# Patient Record
Sex: Female | Born: 1965 | Race: White | Hispanic: No | Marital: Single | Smoking: Never smoker
Health system: Southern US, Community
[De-identification: ages and names within clinical notes are randomized; demographics above are authoritative.]

## PROBLEM LIST (undated history)

## (undated) DIAGNOSIS — F32A Depression, unspecified: Secondary | ICD-10-CM

## (undated) DIAGNOSIS — E079 Disorder of thyroid, unspecified: Secondary | ICD-10-CM

## (undated) DIAGNOSIS — Z87442 Personal history of urinary calculi: Secondary | ICD-10-CM

## (undated) DIAGNOSIS — F329 Major depressive disorder, single episode, unspecified: Secondary | ICD-10-CM

## (undated) HISTORY — PX: CHOLECYSTECTOMY: SHX55

---

## 2003-11-25 ENCOUNTER — Emergency Department: Payer: Self-pay | Admitting: Emergency Medicine

## 2016-03-07 ENCOUNTER — Encounter (HOSPITAL_COMMUNITY): Payer: Self-pay | Admitting: *Deleted

## 2016-03-07 ENCOUNTER — Emergency Department (HOSPITAL_COMMUNITY)
Admission: EM | Admit: 2016-03-07 | Discharge: 2016-03-07 | Disposition: A | Discharge status: Home / Self Care | Payer: BLUE CROSS/BLUE SHIELD | Attending: Emergency Medicine | Admitting: Emergency Medicine

## 2016-03-07 DIAGNOSIS — R5383 Other fatigue: Secondary | ICD-10-CM | POA: Diagnosis present

## 2016-03-07 DIAGNOSIS — R112 Nausea with vomiting, unspecified: Secondary | ICD-10-CM | POA: Insufficient documentation

## 2016-03-07 DIAGNOSIS — J111 Influenza due to unidentified influenza virus with other respiratory manifestations: Secondary | ICD-10-CM | POA: Insufficient documentation

## 2016-03-07 DIAGNOSIS — R69 Illness, unspecified: Secondary | ICD-10-CM

## 2016-03-07 DIAGNOSIS — R197 Diarrhea, unspecified: Secondary | ICD-10-CM | POA: Insufficient documentation

## 2016-03-07 HISTORY — DX: Depression, unspecified: F32.A

## 2016-03-07 HISTORY — DX: Disorder of thyroid, unspecified: E07.9

## 2016-03-07 HISTORY — DX: Major depressive disorder, single episode, unspecified: F32.9

## 2016-03-07 LAB — COMPREHENSIVE METABOLIC PANEL
ALBUMIN: 3.9 g/dL (ref 3.5–5.0)
ALK PHOS: 70 U/L (ref 38–126)
ALT: 40 U/L (ref 14–54)
ANION GAP: 8 (ref 5–15)
AST: 42 U/L — ABNORMAL HIGH (ref 15–41)
BUN: 8 mg/dL (ref 6–20)
CALCIUM: 10.1 mg/dL (ref 8.9–10.3)
CHLORIDE: 105 mmol/L (ref 101–111)
CO2: 27 mmol/L (ref 22–32)
Creatinine, Ser: 0.73 mg/dL (ref 0.44–1.00)
GFR calc non Af Amer: >60 mL/min (ref >60)
Glucose, Bld: 93 mg/dL (ref 65–99)
POTASSIUM: 4.3 mmol/L (ref 3.5–5.1)
SODIUM: 140 mmol/L (ref 135–145)
Total Bilirubin: 0.5 mg/dL (ref 0.3–1.2)
Total Protein: 7.3 g/dL (ref 6.5–8.1)

## 2016-03-07 LAB — CBC
HEMATOCRIT: 40.5 % (ref 36.0–46.0)
HEMOGLOBIN: 13.6 g/dL (ref 12.0–15.0)
MCH: 29.4 pg (ref 26.0–34.0)
MCHC: 33.6 g/dL (ref 30.0–36.0)
MCV: 87.5 fL (ref 78.0–100.0)
Platelets: 233 10*3/uL (ref 150–400)
RBC: 4.63 MIL/uL (ref 3.87–5.11)
RDW: 13.4 % (ref 11.5–15.5)
WBC: 7.3 10*3/uL (ref 4.0–10.5)

## 2016-03-07 LAB — LIPASE, BLOOD: LIPASE: 24 U/L (ref 11–51)

## 2016-03-07 LAB — I-STAT TROPONIN, ED: Troponin i, poc: 0 ng/mL (ref 0.00–0.08)

## 2016-03-07 LAB — TSH: TSH: 0.672 u[IU]/mL (ref 0.350–4.500)

## 2016-03-07 LAB — T4, FREE: Free T4: 0.96 ng/dL (ref 0.61–1.12)

## 2016-03-07 MED ORDER — SODIUM CHLORIDE 0.9 % IV BOLUS (SEPSIS)
1000.0000 mL | Freq: Once | INTRAVENOUS | Status: AC
  Administered 2016-03-07: 1000 mL via INTRAVENOUS

## 2016-03-07 MED ORDER — ONDANSETRON HCL 4 MG/2ML IJ SOLN
4.0000 mg | Freq: Once | INTRAMUSCULAR | Status: AC
  Administered 2016-03-07: 4 mg via INTRAVENOUS
  Filled 2016-03-07: qty 2

## 2016-03-07 MED ORDER — ONDANSETRON HCL 4 MG PO TABS: 4.0000 mg | ORAL_TABLET | Freq: Four times a day (QID) | ORAL | 0 refills | Status: AC

## 2016-03-07 NOTE — ED Provider Notes (Signed)
[facility]-EMERGENCY DEPT Provider Note   CSN: [id number] Arrival date & time: 03/07/16  1247   By signing my name below, I, Soijett Blue, attest that this documentation has been prepared under the direction and in the presence of Lavera Guiseana Duo Genea Rheaume, MD. Electronically Signed: Soijett Blue, ED Scribe. 03/07/16. 3:02 PM.  History   Chief Complaint Chief Complaint  Patient presents with  . Dizziness  . Fatigue  . Diarrhea    HPI Sierra Spears is a 51 y.o. female with a PMHx of thyroid dx, who presents to the Emergency Department complaining of dizziness onset 4 days ago. Pt states that she stopped taking her thyroid medications 5 months ago due to financial issues. Pt notes that she was seen at [facility] urgent care with negative strep, flu, and unremarkable EKG and informed to come into the ED for further evaluation. Pt is having associated symptoms of productive cough x yellow sputum, nasal congestion, intermittent fever of 101.7, generalized weakness, chills, vomiting, diarrhea, and urinary frequency. She hasn't tried any medications for the relief of her symptoms. She denies dysuria and any other symptoms. Pt notes that she does have a PCP. Pt notes that her LMP was 5 months ago and she no longer has menstrual cycles.    The history is provided by the patient. No language interpreter was used.    Past Medical History:  Diagnosis Date  . Depression   . Thyroid disease     There are no active problems to display for this patient.   History reviewed. No pertinent surgical history.  OB History    No data available       Home Medications    Prior to Admission medications   Not on File    Family History History reviewed. No pertinent family history.  Social History Social History  Substance Use Topics  . Smoking status: Never Smoker  . Smokeless tobacco: Not on file  . Alcohol use No     Allergies   Patient has no known allergies.   Review of Systems Review of  Systems 10/14 systems reviewed and are negative other than those stated in the HPI  Physical Exam Updated Vital Signs BP 125/87 (BP Location: Left Arm)   Pulse 76   Temp 99 F (37.2 C) (Oral)   Resp 18   Ht 5' 3.5" (1.613 m)   Wt 132 lb 2 oz (59.9 kg)   SpO2 100%   BMI 23.04 kg/m   Physical Exam Physical Exam  Nursing note and vitals reviewed. Constitutional: Well developed, well nourished, non-toxic, and in no acute distress Head: Normocephalic and atraumatic.  Mouth/Throat: Oropharynx is clear and moist.  Neck: Normal range of motion. Neck supple.  Cardiovascular: Normal rate and regular rhythm.   Pulmonary/Chest: Effort normal and breath sounds normal.  Abdominal: Soft. There is no tenderness. There is no rebound and no guarding.  Musculoskeletal: Normal range of motion.  Neurological: Alert, no facial droop, fluent speech, moves all extremities symmetrically Skin: Skin is warm and dry.  Psychiatric: Cooperative   ED Treatments / Results  DIAGNOSTIC STUDIES: Oxygen Saturation is 100% on RA, nl by my interpretation.    COORDINATION OF CARE: 3:00 PM Discussed treatment plan with pt at bedside which includes labs, UA, EKG, and pt agreed to plan.   Labs (all labs ordered are listed, but only abnormal results are displayed) Labs Reviewed  COMPREHENSIVE METABOLIC PANEL - Abnormal; Notable for the following:       Result Value  AST 42 (*)    All other components within normal limits  LIPASE, BLOOD  CBC  URINALYSIS, ROUTINE W REFLEX MICROSCOPIC  TSH  T4, FREE  I-STAT TROPOININ, ED    EKG  EKG Interpretation  Date/Time:  Wednesday March 07 2016 15:30:36 EST Ventricular Rate:  71 PR Interval:    QRS Duration: 76 QT Interval:  411 QTC Calculation: 447 R Axis:   63 Text Interpretation:  Sinus rhythm no prior EKG  normal EKG  Confirmed by Avia Merkley MD, Annabelle Harman ([id number]) on 03/07/2016 3:47:14 PM       Radiology No results found.  Procedures Procedures (including  critical care time)  Medications Ordered in ED Medications  sodium chloride 0.9 % bolus 1,000 mL (1,000 mLs Intravenous New Bag/Given 03/07/16 1630)  ondansetron (ZOFRAN) injection 4 mg (4 mg Intravenous Given 03/07/16 1639)     Initial Impression / Assessment and Plan / ED Course  I have reviewed the triage vital signs and the nursing notes.  Pertinent labs & imaging results that were available during my care of the patient were reviewed by me and considered in my medical decision making (see chart for details).     51 year old who presents with fatigue, cough, congestion, fevers, nausea, vomiting and diarrhea ongoing for the past few days. Presentation seems consistent with that of flulike viral illness. She did have chest x-ray, influenza swab, and strep swab performed at urgent care today, which was reported normal.   Blood work here today is reassuring. Thyroid studies are obtained and pending, but discussed with her that if she has symptoms of hypothyroid since self-discontinuing her thyroid medications, as she likely needs to be restarted on these medications. Referred to her primary care doctor regarding this issue.  She was also sent to ED for evaluation of ACS due to sinus bradycardia on EKG at urgent care. This does not seem to be an indication for ACS rule out. Her EKG today shows no concerning changes for acute ischemia or infarction.Troponin 1 is negative. Symptoms not consistent with that of ACS or other serious cardiopulmonary/intrathoracic etiology at this time.  Discuss continued supportive care for flulike illness. Strict return and follow-up instructions reviewed. She expressed understanding of all discharge instructions and felt comfortable with the plan of care.   Final Clinical Impressions(s) / ED Diagnoses   Final diagnoses:  Influenza-like illness  Nausea vomiting and diarrhea    New Prescriptions New Prescriptions   No medications on file   I personally  performed the services described in this documentation, which was scribed in my presence. The recorded information has been reviewed and is accurate.     Lavera Guise, MD 03/07/16 [phone]

## 2016-03-07 NOTE — Discharge Instructions (Signed)
You have a viral illness. Drink fluids and get plenty of rest. Take nausea medications as needed for nausea or vomiting.  Please follow-up with your primary care doctor regarding further management of your thyroid disease  Return without fail for worsening symptoms, including fever > 5-6 days, difficulty breathing, passing out, escalating pain, or any other symptoms concerning to you.

## 2016-03-07 NOTE — ED Notes (Signed)
EKG given to Dr. Liu.  

## 2016-03-07 NOTE — ED Triage Notes (Signed)
Pt reports feeling bad since Saturday. Went to [facility] and sent here for further eval. Reports feeling fatigued, having productive cough, n/v/d. Had negative chest xray done, negative strep and flu. Sent here for further blood work including thyroid. No acute distress is noted at triage.

## 2016-06-22 HISTORY — PX: URETERAL STENT PLACEMENT: SHX822

## 2016-06-25 ENCOUNTER — Ambulatory Visit (HOSPITAL_COMMUNITY)
Admission: RE | Admit: 2016-06-25 | Discharge: 2016-06-25 | Disposition: A | Discharge status: Home / Self Care | Payer: BLUE CROSS/BLUE SHIELD | Source: Ambulatory Visit | Attending: Urology | Admitting: Urology

## 2016-06-25 ENCOUNTER — Encounter (HOSPITAL_COMMUNITY): Payer: Self-pay | Admitting: *Deleted

## 2016-06-25 ENCOUNTER — Encounter (HOSPITAL_COMMUNITY)
Admission: RE | Disposition: A | Discharge status: Home / Self Care | Payer: Self-pay | Source: Ambulatory Visit | Attending: Urology

## 2016-06-25 ENCOUNTER — Ambulatory Visit (HOSPITAL_COMMUNITY): Payer: BLUE CROSS/BLUE SHIELD

## 2016-06-25 DIAGNOSIS — N200 Calculus of kidney: Secondary | ICD-10-CM

## 2016-06-25 HISTORY — DX: Personal history of urinary calculi: Z87.442

## 2016-06-25 HISTORY — PX: EXTRACORPOREAL SHOCK WAVE LITHOTRIPSY: SHX1557

## 2016-06-25 LAB — PREGNANCY, URINE: Preg Test, Ur: NEGATIVE

## 2016-06-25 SURGERY — LITHOTRIPSY, ESWL
Anesthesia: LOCAL | Laterality: Left

## 2016-06-25 MED ORDER — DIAZEPAM 5 MG PO TABS
10.0000 mg | ORAL_TABLET | ORAL | Status: AC
  Administered 2016-06-25: 10 mg via ORAL
  Filled 2016-06-25: qty 2

## 2016-06-25 MED ORDER — LEVOFLOXACIN IN D5W 500 MG/100ML IV SOLN
500.0000 mg | Freq: Once | INTRAVENOUS | Status: AC
  Administered 2016-06-25: 500 mg via INTRAVENOUS
  Filled 2016-06-25: qty 100

## 2016-06-25 MED ORDER — DEXTROSE IN LACTATED RINGERS 5 % IV SOLN
INTRAVENOUS | Status: DC
  Administered 2016-06-25: 16:00:00 via INTRAVENOUS

## 2016-06-25 MED ORDER — DIPHENHYDRAMINE HCL 25 MG PO CAPS
25.0000 mg | ORAL_CAPSULE | ORAL | Status: AC
  Administered 2016-06-25: 25 mg via ORAL
  Filled 2016-06-25: qty 1

## 2016-06-25 NOTE — Discharge Instructions (Signed)
Lithotripsy, Care After °This sheet gives you information about how to care for yourself after your procedure. Your health care provider may also give you more specific instructions. If you have problems or questions, contact your health care provider. °What can I expect after the procedure? °After the procedure, it is common to have: °· Some blood in your urine. This should only last for a few days. °· Soreness in your back, sides, or upper abdomen for a few days. °· Blotches or bruises on your back where the pressure wave entered the skin. °· Pain, discomfort, or nausea when pieces (fragments) of the kidney stone move through the tube that carries urine from the kidney to the bladder (ureter). Stone fragments may pass soon after the procedure, but they may continue to pass for up to 4-8 weeks. °? If you have severe pain or nausea, contact your health care provider. This may be caused by a large stone that was not broken up, and this may mean that you need more treatment. °· Some pain or discomfort during urination. °· Some pain or discomfort in the lower abdomen or (in men) at the base of the penis. ° °Follow these instructions at home: °Medicines °· Take over-the-counter and prescription medicines only as told by your health care provider. °· If you were prescribed an antibiotic medicine, take it as told by your health care provider. Do not stop taking the antibiotic even if you start to feel better. °· Do not drive for 24 hours if you were given a medicine to help you relax (sedative). °· Do not drive or use heavy machinery while taking prescription pain medicine. °Eating and drinking °· Drink enough water and fluids to keep your urine clear or pale yellow. This helps any remaining pieces of the stone to pass. It can also help prevent new stones from forming. °· Eat plenty of fresh fruits and vegetables. °· Follow instructions from your health care provider about eating and drinking restrictions. You may be  instructed: °? To reduce how much salt (sodium) you eat or drink. Check ingredients and nutrition facts on packaged foods and beverages. °? To reduce how much meat you eat. °· Eat the recommended amount of calcium for your age and gender. Ask your health care provider how much calcium you should have. °General instructions °· Get plenty of rest. °· Most people can resume normal activities 1-2 days after the procedure. Ask your health care provider what activities are safe for you. °· If directed, strain all urine through the strainer that was provided by your health care provider. °? Keep all fragments for your health care provider to see. Any stones that are found may be sent to a medical lab for examination. The stone may be as small as a grain of salt. °· Keep all follow-up visits as told by your health care provider. This is important. °Contact a health care provider if: °· You have pain that is severe or does not get better with medicine. °· You have nausea that is severe or does not go away. °· You have blood in your urine longer than your health care provider told you to expect. °· You have more blood in your urine. °· You have pain during urination that does not go away. °· You urinate more frequently than usual and this does not go away. °· You develop a rash or any other possible signs of an allergic reaction. °Get help right away if: °· You have severe pain in   your back, sides, or upper abdomen. °· You have severe pain while urinating. °· Your urine is very dark red. °· You have blood in your stool (feces). °· You cannot pass any urine at all. °· You feel a strong urge to urinate after emptying your bladder. °· You have a fever or chills. °· You develop shortness of breath, difficulty breathing, or chest pain. °· You have severe nausea that leads to persistent vomiting. °· You faint. °Summary °· After this procedure, it is common to have some pain, discomfort, or nausea when pieces (fragments) of the  kidney stone move through the tube that carries urine from the kidney to the bladder (ureter). If this pain or nausea is severe, however, you should contact your health care provider. °· Most people can resume normal activities 1-2 days after the procedure. Ask your health care provider what activities are safe for you. °· Drink enough water and fluids to keep your urine clear or pale yellow. This helps any remaining pieces of the stone to pass, and it can help prevent new stones from forming. °· If directed, strain your urine and keep all fragments for your health care provider to see. Fragments or stones may be as small as a grain of salt. °· Get help right away if you have severe pain in your back, sides, or upper abdomen or have severe pain while urinating. °This information is not intended to replace advice given to you by your health care provider. Make sure you discuss any questions you have with your health care provider. °Document Released: 02/18/2007 Document Revised: 12/21/2015 Document Reviewed: 12/21/2015 °Elsevier Interactive Patient Education © 2017 [employer] ° °

## 2016-06-26 ENCOUNTER — Encounter (HOSPITAL_COMMUNITY): Payer: Self-pay | Admitting: Urology

## 2017-04-15 DIAGNOSIS — R200 Anesthesia of skin: Secondary | ICD-10-CM | POA: Diagnosis not present

## 2017-04-15 DIAGNOSIS — R51 Headache: Secondary | ICD-10-CM | POA: Diagnosis not present

## 2017-04-15 DIAGNOSIS — Z720 Tobacco use: Secondary | ICD-10-CM | POA: Diagnosis not present

## 2017-04-15 DIAGNOSIS — F1210 Cannabis abuse, uncomplicated: Secondary | ICD-10-CM | POA: Diagnosis not present

## 2017-04-16 DIAGNOSIS — F1210 Cannabis abuse, uncomplicated: Secondary | ICD-10-CM | POA: Diagnosis not present

## 2017-04-16 DIAGNOSIS — R51 Headache: Secondary | ICD-10-CM | POA: Diagnosis not present

## 2017-04-16 DIAGNOSIS — R200 Anesthesia of skin: Secondary | ICD-10-CM | POA: Diagnosis not present

## 2017-04-16 DIAGNOSIS — Z720 Tobacco use: Secondary | ICD-10-CM | POA: Diagnosis not present

## 2018-08-23 ENCOUNTER — Emergency Department (HOSPITAL_COMMUNITY): Payer: Self-pay

## 2018-08-23 ENCOUNTER — Other Ambulatory Visit: Payer: Self-pay

## 2018-08-23 ENCOUNTER — Emergency Department (HOSPITAL_COMMUNITY)
Admission: EM | Admit: 2018-08-23 | Discharge: 2018-08-23 | Disposition: A | Discharge status: Home / Self Care | Payer: Self-pay | Attending: Emergency Medicine | Admitting: Emergency Medicine

## 2018-08-23 ENCOUNTER — Encounter (HOSPITAL_COMMUNITY): Payer: Self-pay | Admitting: *Deleted

## 2018-08-23 DIAGNOSIS — Z87891 Personal history of nicotine dependence: Secondary | ICD-10-CM | POA: Insufficient documentation

## 2018-08-23 DIAGNOSIS — E079 Disorder of thyroid, unspecified: Secondary | ICD-10-CM | POA: Insufficient documentation

## 2018-08-23 DIAGNOSIS — R0789 Other chest pain: Secondary | ICD-10-CM | POA: Insufficient documentation

## 2018-08-23 DIAGNOSIS — R002 Palpitations: Secondary | ICD-10-CM

## 2018-08-23 DIAGNOSIS — Z79899 Other long term (current) drug therapy: Secondary | ICD-10-CM | POA: Insufficient documentation

## 2018-08-23 DIAGNOSIS — R079 Chest pain, unspecified: Secondary | ICD-10-CM

## 2018-08-23 LAB — BASIC METABOLIC PANEL
Anion gap: 9 (ref 5–15)
BUN: 9 mg/dL (ref 6–20)
CO2: 26 mmol/L (ref 22–32)
Calcium: 9.6 mg/dL (ref 8.9–10.3)
Chloride: 106 mmol/L (ref 98–111)
Creatinine, Ser: 0.82 mg/dL (ref 0.44–1.00)
GFR calc Af Amer: >60 mL/min (ref >60)
GFR calc non Af Amer: >60 mL/min (ref >60)
Glucose, Bld: 108 mg/dL — ABNORMAL HIGH (ref 70–99)
Potassium: 3.8 mmol/L (ref 3.5–5.1)
Sodium: 141 mmol/L (ref 135–145)

## 2018-08-23 LAB — TROPONIN I (HIGH SENSITIVITY)
Troponin I (High Sensitivity): 3 ng/L (ref <18)
Troponin I (High Sensitivity): <2 ng/L (ref <18)

## 2018-08-23 LAB — CBC
HCT: 42.8 % (ref 36.0–46.0)
Hemoglobin: 14.2 g/dL (ref 12.0–15.0)
MCH: 31 pg (ref 26.0–34.0)
MCHC: 33.2 g/dL (ref 30.0–36.0)
MCV: 93.4 fL (ref 80.0–100.0)
Platelets: 291 10*3/uL (ref 150–400)
RBC: 4.58 MIL/uL (ref 3.87–5.11)
RDW: 12.9 % (ref 11.5–15.5)
WBC: 9.3 10*3/uL (ref 4.0–10.5)
nRBC: 0 % (ref 0.0–0.2)

## 2018-08-23 LAB — I-STAT BETA HCG BLOOD, ED (MC, WL, AP ONLY): I-stat hCG, quantitative: 5.8 m[IU]/mL — ABNORMAL HIGH (ref <5)

## 2018-08-23 MED ORDER — ACETAMINOPHEN 500 MG PO TABS: 1000.0000 mg | ORAL_TABLET | Freq: Once | ORAL | Status: DC

## 2018-08-23 MED ORDER — SODIUM CHLORIDE 0.9% FLUSH: 3.0000 mL | Freq: Once | INTRAVENOUS | Status: DC

## 2018-08-23 MED ORDER — FAMOTIDINE 20 MG PO TABS: 20.0000 mg | ORAL_TABLET | Freq: Once | ORAL | Status: DC

## 2018-08-23 MED ORDER — ALUM & MAG HYDROXIDE-SIMETH 200-200-20 MG/5ML PO SUSP: 30.0000 mL | Freq: Once | ORAL | Status: DC

## 2018-08-23 MED ORDER — OMEPRAZOLE 20 MG PO CPDR: 20.0000 mg | DELAYED_RELEASE_CAPSULE | Freq: Every day | ORAL | 0 refills | Status: AC

## 2018-08-23 NOTE — ED Triage Notes (Signed)
C/o chest burning and feeling like her heart is racing. Onset yest

## 2018-08-23 NOTE — ED Provider Notes (Signed)
[facility] EM[facility]GENCY [facility] Provider Note   CSN: [id number] Arrival date & time: 08/23/18  0550    History   Chief Complaint Chief Complaint  Patient presents with   Chest Pain    HPI Sierra Spears is a 53 y.o. female.     HPI Patient reports she has been having burning chest pain on and off since yesterday.  She reports it feels like a burning sensation across her front chest on both sides.  She reports it does hurt more when she moves or takes a deep breath.  He does not really feel short of breath..  She reports that she just quit smoking cigarettes and she has quit for good now.  She does however report that she got some improvement in symptoms by smoking marijuana.  Patient denies she has had cough, hemoptysis or fever.  He denies any pain or swelling in her legs.  She denies any URI symptoms.  No vomiting or diarrhea.  No history of similar problems of chest pain.  She does however report she has had reflux in the past but is not taking any medication currently.  Family history negative for sudden death or early heart attack.  She reports she does believe her father has a stent but is living.  Social history positive for cigarette use.  Patient reports she is quitting as of now.  Denies any alcohol or other drugs of abuse but endorses marijuana smoking. Past Medical History:  Diagnosis Date   Depression    History of kidney stones    left side   Thyroid disease     There are no active problems to display for this patient.   Past Surgical History:  Procedure Laterality Date   CHOLECYSTECTOMY     2010   EXTRACORPOREAL SHOCK WAVE LITHOTRIPSY Left 06/25/2016   Procedure: LEFT EXTRACORPOREAL SHOCK WAVE LITHOTRIPSY (ESWL);  Surgeon: Debroah Ballerhao, Roberto, MD;  Location: [facility] ORS;  Service: Urology;  Laterality: Left;   URETERAL STENT PLACEMENT Left 06/22/2016     OB History   No obstetric history on file.      Home Medications    Prior to  Admission medications   Medication Sig Start Date End Date Taking? Authorizing Provider  zolpidem (AMBIEN) 5 MG tablet Take 5 mg by mouth at bedtime as needed for sleep.   Yes [provider]  omeprazole (PRILOSEC) 20 MG capsule Take 1 capsule (20 mg total) by mouth daily. 08/23/18   Arby BarrettePfeiffer, Quantae Martel, MD  ondansetron (ZOFRAN) 4 MG tablet Take 1 tablet (4 mg total) by mouth every 6 (six) hours. Patient not taking: Reported on 08/23/2018 03/07/16   Lavera GuiseLiu, Dana Duo, MD    Family History No family history on file.  Social History Social History   Tobacco Use   Smoking status: Never Smoker   Smokeless tobacco: Never Used  Substance Use Topics   Alcohol use: No   Drug use: Yes    Types: Marijuana    Comment: last used 1 week- 06/1416- 1800     Allergies   Patient has no known allergies.   Review of Systems Review of Systems 10 Systems reviewed and are negative for acute change except as noted in the HPI.   Physical Exam Updated Vital Signs BP 111/72    Pulse 60    Temp 98.5 F (36.9 C) (Oral)    Resp 13    Ht 5' 3.5" (1.613 m)    Wt 54.4 kg  LMP 09/26/2015    SpO2 96%    BMI 20.92 kg/m   Physical Exam Constitutional:      Appearance: She is well-developed.     Comments: Clinically well in appearance.  Nontoxic alert.  No respiratory distress.  HENT:     Head: Normocephalic and atraumatic.  Eyes:     Extraocular Movements: Extraocular movements intact.     Conjunctiva/sclera: Conjunctivae normal.  Neck:     Musculoskeletal: Neck supple.  Cardiovascular:     Rate and Rhythm: Normal rate and regular rhythm.     Heart sounds: Normal heart sounds.  Pulmonary:     Effort: Pulmonary effort is normal.     Breath sounds: Normal breath sounds.     Comments: Breath sounds are clear with good and equal flow.  No wheeze or crackle.  Patient does wince with sitting forward due to chest pain and also winces and very uncomfortable with palpation along with sternocostal  borders bilaterally of the lower half. Chest:     Chest wall: Tenderness present.  Abdominal:     General: Bowel sounds are normal. There is no distension.     Palpations: Abdomen is soft.     Tenderness: There is no abdominal tenderness.  Musculoskeletal: Normal range of motion.        General: No tenderness.     Comments: Lower extremities excellent condition.  No peripheral edema.  Calves are soft and nontender.  Skin:    General: Skin is warm and dry.  Neurological:     Mental Status: She is alert and oriented to person, place, and time.     GCS: GCS eye subscore is 4. GCS verbal subscore is 5. GCS motor subscore is 6.     Coordination: Coordination normal.      ED Treatments / Results  Labs (all labs ordered are listed, but only abnormal results are displayed) Labs Reviewed  BASIC METABOLIC PANEL - Abnormal; Notable for the following components:      Result Value   Glucose, Bld 108 (*)    All other components within normal limits  I-STAT BETA HCG BLOOD, ED ([facility], [facility], [facility] ONLY) - Abnormal; Notable for the following components:   I-stat hCG, quantitative 5.8 (*)    All other components within normal limits  CBC  TROPONIN I (HIGH SENSITIVITY)  TROPONIN I (HIGH SENSITIVITY)    EKG EKG Interpretation  Date/Time:  Saturday August 23 2018 06:03:25 EDT Ventricular Rate:  71 PR Interval:  104 QRS Duration: 88 QT Interval:  394 QTC Calculation: 428 R Axis:   88 Text Interpretation:  Sinus rhythm with short PR Otherwise normal ECG No significant change was found Confirmed by Glynn Octaveancour, Stephen [phone]([id number]) on 08/23/2018 6:48:52 AM   Radiology Dg Chest 2 View  Result Date: 08/23/2018 CLINICAL DATA:  Burning in the chest. EXAM: CHEST - 2 VIEW COMPARISON:  None. FINDINGS: Cardiac silhouette is normal in size. No mediastinal or hilar masses or evidence of adenopathy. Lungs are hyperexpanded but clear. No pleural effusion or pneumothorax. Skeletal structures are intact. IMPRESSION: No  active cardiopulmonary disease. Electronically Signed   By: Amie Portlandavid  Ormond M.D.   On: 08/23/2018 06:32    Procedures Procedures (including critical care time)  Medications Ordered in ED Medications  sodium chloride flush (NS) 0.9 % injection 3 mL (has no administration in time range)  famotidine (PEPCID) tablet 20 mg (has no administration in time range)  alum & mag hydroxide-simeth (MAALOX/MYLANTA) 200-200-20 MG/5ML suspension 30 mL (has  no administration in time range)  acetaminophen (TYLENOL) tablet 1,000 mg (has no administration in time range)     Initial Impression / Assessment and Plan / ED Course  I have reviewed the triage vital signs and the nursing notes.  Pertinent labs & imaging results that were available during my care of the patient were reviewed by me and considered in my medical decision making (see chart for details).       Diagnostic evaluation within normal limits.  Chest pain highly suspicious for chest wall pain and gastritis.  Patient did get apparent improvement with marijuana use however question whether this could have exacerbated gastritis and chest wall type symptoms.  Patient has burning quality and very reproducible pain with position change and palpation.  Cardiac auscultation normal without murmur.  EKG stable from previous.  Low suspicion for PE.  No tachypnea or tachycardia.  No lower extremity swelling or calf pain.  Chest pain is atypical for PE.  At this time will start treatment for gastritis backslash reflux and Tylenol for chest wall pain.  Patient is advised she must get follow-up with PCP.  Return precautions reviewed.  Final Clinical Impressions(s) / ED Diagnoses   Final diagnoses:  Nonspecific chest pain  Palpitation    ED Discharge Orders         Ordered    omeprazole (PRILOSEC) 20 MG capsule  Daily     08/23/18 0805           Charlesetta Shanks, MD 08/23/18 [phone]

## 2018-08-23 NOTE — ED Notes (Signed)
Pt did not want to wait for medications, pt walked into hallway stating she was discharged. Pt agreed to wait for papers, I brought her papers and went over her new prescriptions. Verbalizes understanding of DC instructions.

## 2018-08-23 NOTE — Discharge Instructions (Signed)
1.  Take omeprazole daily for the next 2 weeks.  Follow instructions for reflux. 2.  Continue to quit smoking. 3.  Take acetaminophen every 6 hours as needed for chest pain with movements and position changes. 4.  Avoid marijuana.  Although it have relaxing effects, it can worsen reflux, palpitations and sometimes causes bouts of vomiting. 5.  It is very important that you follow-up with a family doctor.  If you do not have one, you can use the referral number in your discharge or go to [facility] as listed to become established as a patient.

## 2019-07-05 ENCOUNTER — Emergency Department (HOSPITAL_COMMUNITY): Payer: No Typology Code available for payment source

## 2019-07-05 ENCOUNTER — Emergency Department (HOSPITAL_COMMUNITY)
Admission: EM | Admit: 2019-07-05 | Discharge: 2019-07-05 | Disposition: A | Discharge status: Home / Self Care | Payer: No Typology Code available for payment source | Attending: Emergency Medicine | Admitting: Emergency Medicine

## 2019-07-05 ENCOUNTER — Other Ambulatory Visit: Payer: Self-pay

## 2019-07-05 DIAGNOSIS — S39012A Strain of muscle, fascia and tendon of lower back, initial encounter: Secondary | ICD-10-CM

## 2019-07-05 DIAGNOSIS — M545 Low back pain: Secondary | ICD-10-CM | POA: Diagnosis present

## 2019-07-05 DIAGNOSIS — Y939 Activity, unspecified: Secondary | ICD-10-CM | POA: Insufficient documentation

## 2019-07-05 DIAGNOSIS — Y999 Unspecified external cause status: Secondary | ICD-10-CM | POA: Diagnosis not present

## 2019-07-05 DIAGNOSIS — Z79899 Other long term (current) drug therapy: Secondary | ICD-10-CM | POA: Insufficient documentation

## 2019-07-05 DIAGNOSIS — Y929 Unspecified place or not applicable: Secondary | ICD-10-CM | POA: Insufficient documentation

## 2019-07-05 MED ORDER — CYCLOBENZAPRINE HCL 10 MG PO TABS
5.0000 mg | ORAL_TABLET | Freq: Once | ORAL | Status: AC
  Administered 2019-07-05: 5 mg via ORAL
  Filled 2019-07-05: qty 1

## 2019-07-05 MED ORDER — IBUPROFEN 800 MG PO TABS: 800.0000 mg | ORAL_TABLET | Freq: Once | ORAL | Status: DC

## 2019-07-05 MED ORDER — CELECOXIB 200 MG PO CAPS: 200.0000 mg | ORAL_CAPSULE | Freq: Two times a day (BID) | ORAL | 0 refills | Status: AC

## 2019-07-05 MED ORDER — CYCLOBENZAPRINE HCL 10 MG PO TABS
5.0000 mg | ORAL_TABLET | Freq: Two times a day (BID) | ORAL | 0 refills | Status: AC | PRN
Start: 2019-07-05 — End: ?
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
## 2019-07-05 NOTE — Discharge Instructions (Addendum)
Your xray showed some age related arthritis.  You appear to have a lumbar strain injury. I am discharging you with anti-inflammatory pain medication and Flexeril.  This is a muscle relaxer.  Can make you sleepy.  Do not drink alcohol, drive or make important decisions when taking this medication.  Please read the attached information and handouts about your injury, home care and reasons to seek immediate medical care.  Return to the emergency department immediately if you develop any of the following symptoms: You have numbness, tingling, or weakness in the arms or legs. You develop severe headaches not relieved with medicine. You have severe neck pain, especially tenderness in the middle of the back of your neck. You have changes in bowel or bladder control. There is increasing pain in any area of the body. You have shortness of breath, light-headedness, dizziness, or fainting. You have chest pain. You feel sick to your stomach (nauseous), throw up (vomit), or sweat. You have increasing abdominal discomfort. There is blood in your urine, stool, or vomit. You have pain in your shoulder (shoulder strap areas). You feel your symptoms are getting worse.
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
